# Patient Record
Sex: Female | Born: 1960 | Race: Black or African American | Hispanic: No | Marital: Single | Smoking: Never smoker
Health system: Southern US, Community
[De-identification: ages and names within clinical notes are randomized; demographics above are authoritative.]

## PROBLEM LIST (undated history)

## (undated) DIAGNOSIS — J45909 Unspecified asthma, uncomplicated: Secondary | ICD-10-CM

## (undated) DIAGNOSIS — I10 Essential (primary) hypertension: Secondary | ICD-10-CM

## (undated) HISTORY — PX: HERNIA REPAIR: SHX51

## (undated) HISTORY — PX: APPENDECTOMY: SHX54

---

## 2017-02-20 HISTORY — PX: JOINT REPLACEMENT: SHX530

## 2017-04-16 ENCOUNTER — Emergency Department (HOSPITAL_COMMUNITY): Payer: Medicaid - Out of State

## 2017-04-16 ENCOUNTER — Emergency Department (HOSPITAL_BASED_OUTPATIENT_CLINIC_OR_DEPARTMENT_OTHER)
Admit: 2017-04-16 | Discharge: 2017-04-16 | Disposition: A | Discharge status: Home / Self Care | Payer: Medicaid - Out of State | Attending: Emergency Medicine | Admitting: Emergency Medicine

## 2017-04-16 ENCOUNTER — Emergency Department (HOSPITAL_COMMUNITY)
Admission: EM | Admit: 2017-04-16 | Discharge: 2017-04-16 | Disposition: A | Discharge status: Home / Self Care | Payer: Medicaid - Out of State | Attending: Physician Assistant | Admitting: Physician Assistant

## 2017-04-16 ENCOUNTER — Encounter (HOSPITAL_COMMUNITY): Payer: Self-pay

## 2017-04-16 ENCOUNTER — Other Ambulatory Visit: Payer: Self-pay

## 2017-04-16 DIAGNOSIS — I10 Essential (primary) hypertension: Secondary | ICD-10-CM | POA: Diagnosis not present

## 2017-04-16 DIAGNOSIS — M25461 Effusion, right knee: Secondary | ICD-10-CM | POA: Insufficient documentation

## 2017-04-16 DIAGNOSIS — Z96651 Presence of right artificial knee joint: Secondary | ICD-10-CM | POA: Insufficient documentation

## 2017-04-16 DIAGNOSIS — M7989 Other specified soft tissue disorders: Secondary | ICD-10-CM

## 2017-04-16 DIAGNOSIS — Z79899 Other long term (current) drug therapy: Secondary | ICD-10-CM | POA: Insufficient documentation

## 2017-04-16 DIAGNOSIS — M79609 Pain in unspecified limb: Secondary | ICD-10-CM | POA: Diagnosis not present

## 2017-04-16 DIAGNOSIS — J45909 Unspecified asthma, uncomplicated: Secondary | ICD-10-CM | POA: Diagnosis not present

## 2017-04-16 HISTORY — DX: Unspecified asthma, uncomplicated: J45.909

## 2017-04-16 HISTORY — DX: Essential (primary) hypertension: I10

## 2017-04-16 LAB — BASIC METABOLIC PANEL
Anion gap: 10 (ref 5–15)
BUN: 12 mg/dL (ref 6–20)
CHLORIDE: 101 mmol/L (ref 101–111)
CO2: 24 mmol/L (ref 22–32)
Calcium: 9.8 mg/dL (ref 8.9–10.3)
Creatinine, Ser: 0.51 mg/dL (ref 0.44–1.00)
GFR calc Af Amer: >60 mL/min (ref >60)
GFR calc non Af Amer: >60 mL/min (ref >60)
GLUCOSE: 90 mg/dL (ref 65–99)
POTASSIUM: 4 mmol/L (ref 3.5–5.1)
Sodium: 135 mmol/L (ref 135–145)

## 2017-04-16 LAB — CBC
HEMATOCRIT: 39.7 % (ref 36.0–46.0)
HEMOGLOBIN: 12.8 g/dL (ref 12.0–15.0)
MCH: 29.8 pg (ref 26.0–34.0)
MCHC: 32.2 g/dL (ref 30.0–36.0)
MCV: 92.5 fL (ref 78.0–100.0)
Platelets: 333 10*3/uL (ref 150–400)
RBC: 4.29 MIL/uL (ref 3.87–5.11)
RDW: 14.6 % (ref 11.5–15.5)
WBC: 5.3 10*3/uL (ref 4.0–10.5)

## 2017-04-16 MED ORDER — OXYCODONE-ACETAMINOPHEN 5-325 MG PO TABS: 1.0000 | ORAL_TABLET | Freq: Three times a day (TID) | ORAL | 0 refills | Status: AC | PRN

## 2017-04-16 MED ORDER — NAPROXEN 500 MG PO TABS: 500.0000 mg | ORAL_TABLET | Freq: Two times a day (BID) | ORAL | 0 refills | Status: AC

## 2017-04-16 MED ORDER — OXYCODONE-ACETAMINOPHEN 5-325 MG PO TABS
1.0000 | ORAL_TABLET | Freq: Once | ORAL | Status: AC
  Administered 2017-04-16: 1 via ORAL
  Filled 2017-04-16: qty 1

## 2017-04-16 NOTE — ED Notes (Signed)
Patient ambulatory to bathroom with steady gait at this time 

## 2017-04-16 NOTE — ED Triage Notes (Addendum)
Pt reports severe right knee pain and swelling after riding bus to [place] from [place]. Pt denies taking blood thinners. Knee swollen, red, and hot to touch in triage

## 2017-04-16 NOTE — ED Notes (Signed)
Patient verbalizes understanding of discharge instructions. Opportunity for questioning and answers were provided. Armband removed by staff, pt discharged from ED via wheelchair.  

## 2017-04-16 NOTE — Discharge Instructions (Signed)
Please read attached information regarding your condition.

## 2017-04-16 NOTE — ED Notes (Signed)
Patient transported to Ultrasound 

## 2017-04-16 NOTE — ED Notes (Signed)
Patient transported to X-ray 

## 2017-04-16 NOTE — ED Provider Notes (Signed)
[name] EMERGENCY DEPARTMENT Provider Note   CSN: [id number] Arrival date & time: 04/16/17  1018     History   Chief Complaint Chief Complaint  Patient presents with  . Leg Pain    HPI Olivia Green is a 57 y.o. female with past medical history of asthma, hypertension, who presents to ED for evaluation of right knee swelling, increased pain for the past 2 weeks.  She is status post right knee replacement approximately 2 month ago (January 9)  in [place].  She traveled on a bus ride from [place] to [place] approximately 2 weeks ago.  This is a 17-hour bus ride with only one 2-hour break in between.  She states that her knee felt more stiff and unsure if it healed after the surgery fully.  She reported bilateral lower extremity edema but states that the left knee has improved.  She has been ambulatory with a cane and walker secondary to pain after the surgery.  Denies any previous DVT or PE.  Denies any chest pain, shortness of breath or hemoptysis.  She ran out of the narcotic pain medication prescribed by her orthopedist.  She has been having "cold"-like symptoms including cough and congestion. Unsure if she had a fever.  HPI  Past Medical History:  Diagnosis Date  . Asthma   . Hypertension     There are no active problems to display for this patient.   Past Surgical History:  Procedure Laterality Date  . APPENDECTOMY    . CESAREAN SECTION    . HERNIA REPAIR      OB History    No data available       Home Medications    Prior to Admission medications   Medication Sig Start Date End Date Taking? Authorizing Provider  ibuprofen (ADVIL,MOTRIN) 600 MG tablet Take 600 mg by mouth 3 (three) times daily. 04/06/17  Yes [provider]  lidocaine (LIDODERM) 5 % Place 1 patch onto the skin every 12 (twelve) hours. 04/10/17  Yes [provider]  lisinopril (PRINIVIL,ZESTRIL) 10 MG tablet Take 10 mg by mouth every morning.  04/14/17  Yes [provider]  Multiple Vitamin (MULTIVITAMIN WITH MINERALS) TABS tablet Take 1 tablet by mouth daily.   Yes [provider]  omeprazole (PRILOSEC) 20 MG capsule Take 20 mg by mouth daily. 03/18/17  Yes [provider]  Oxycodone HCl 10 MG TABS Take 10-20 mg by mouth every 4 (four) hours as needed for pain. 02/21/17  Yes [provider]  SPIRIVA RESPIMAT 2.5 MCG/ACT AERS Take 2 puffs by mouth daily. 01/15/17  Yes [provider]  VENTOLIN HFA 108 (90 Base) MCG/ACT inhaler Take 2 puffs by mouth every 6 (six) hours as needed for shortness of breath or wheezing. 01/23/17  Yes [provider]  naproxen (NAPROSYN) 500 MG tablet Take 1 tablet (500 mg total) by mouth 2 (two) times daily. 04/16/17   Talyn Eddie, PA-C  oxyCODONE-acetaminophen (PERCOCET/ROXICET) 5-325 MG tablet Take 1 tablet by mouth every 8 (eight) hours as needed for severe pain. 04/16/17   Dietrich Pates, PA-C    Family History No family history on file.  Social History Social History   Tobacco Use  . Smoking status: Never Smoker  . Smokeless tobacco: Never Used  Substance Use Topics  . Alcohol use: No    Frequency: Never  . Drug use: No     Allergies   Patient has no known allergies.  Review of Systems Review of Systems  Constitutional: Negative for appetite change, chills and fever.  HENT: Negative for ear pain, rhinorrhea, sneezing and sore throat.   Eyes: Negative for photophobia and visual disturbance.  Respiratory: Negative for cough, chest tightness, shortness of breath and wheezing.   Cardiovascular: Negative for chest pain and palpitations.  Gastrointestinal: Negative for abdominal pain, blood in stool, constipation, diarrhea, nausea and vomiting.  Genitourinary: Negative for dysuria, hematuria and urgency.  Musculoskeletal: Positive for arthralgias, gait problem, joint swelling and myalgias.  Skin: Negative for rash.  Neurological: Negative for  dizziness, weakness and light-headedness.     Physical Exam Updated Vital Signs BP (!) 148/69 (BP Location: Right Arm)   Pulse 71   Temp 98.3 F (36.8 C) (Oral)   Resp 18   Ht 5\' 3"  (1.6 m)   Wt 99.8 kg (220 lb)   SpO2 96%   BMI 38.97 kg/m   Physical Exam  Constitutional: She appears well-developed and well-nourished. No distress.  HENT:  Head: Normocephalic and atraumatic.  Nose: Nose normal.  Eyes: Conjunctivae and EOM are normal. Left eye exhibits no discharge. No scleral icterus.  Neck: Normal range of motion. Neck supple.  Cardiovascular: Normal rate, regular rhythm, normal heart sounds and intact distal pulses. Exam reveals no gallop and no friction rub.  No murmur heard. Pulmonary/Chest: Effort normal and breath sounds normal. No respiratory distress.  Abdominal: Soft. Bowel sounds are normal. She exhibits no distension. There is no tenderness. There is no guarding.  Musculoskeletal: Normal range of motion. She exhibits edema and tenderness.  Erythema, edema and slight warmth of right knee.  There is a well-healed surgical scar noted.  Patient able to flex and extend knee without difficulty.  2+ DP pulse noted bilaterally.  Neurological: She is alert. She exhibits normal muscle tone. Coordination normal.  Skin: Skin is warm and dry. No rash noted.  Psychiatric: She has a normal mood and affect.  Nursing note and vitals reviewed.    ED Treatments / Results  Labs (all labs ordered are listed, but only abnormal results are displayed) Labs Reviewed  CBC WITH DIFFERENTIAL/PLATELET    EKG  EKG Interpretation None       Radiology Dg Knee Complete 4 Views Right  Result Date: 04/16/2017 CLINICAL DATA:  57 year old female with right knee pain and swelling for the past week. Recent long bus ride and immobile. Initial encounter. EXAM: RIGHT KNEE - COMPLETE 4+ VIEW COMPARISON:  None. FINDINGS: Post total right knee replacement without plain film findings to suggest  loosening. No fracture or dislocation. Joint effusion. IMPRESSION: Post total right knee replacement. Joint effusion. No fracture or dislocation. Electronically Signed   By: Lacy Duverney M.D.   On: 04/16/2017 14:46    Procedures Procedures (including critical care time)  Medications Ordered in ED Medications  oxyCODONE-acetaminophen (PERCOCET/ROXICET) 5-325 MG per tablet 1 tablet (not administered)     Initial Impression / Assessment and Plan / ED Course  I have reviewed the triage vital signs and the nursing notes.  Pertinent labs & imaging results that were available during my care of the patient were reviewed by me and considered in my medical decision making (see chart for details).     Patient presents to ED for evaluation of right knee swelling, erythema right knee for the past 2 weeks.  She is status post right knee replacement 2 months ago.  She did travel on a 17-hour bus ride from [place] to [place] 2  weeks ago.  On physical exam she does have some erythema and edema of the right knee.  She is able to flex and extend without difficulty.  She has 2+ DP pulses noted bilaterally.  Ultrasound was negative for DVT.  X-ray showed joint effusion with no other acute abnormalities.  Lab work including CBC, BMP unremarkable.  She is afebrile with no use of antipyretics. At this time I have low suspicion for vascular or infectious cause of her symptoms. Most likely inflammatory in nature.  Will give Ace wrap, anti-inflammatories.  She states that she will be in [place] for the next few weeks and would like a orthopedist to potentially follow up with here.  Will give contact information for orthopedist on call. Patient appears stable for d/c at this time. Strict return precautions given.  Portions of this note were generated with Scientist, clinical (histocompatibility and immunogenetics)Dragon dictation software. Dictation errors may occur despite best attempts at proofreading.   Final Clinical Impressions(s) / ED Diagnoses   Final  diagnoses:  Effusion of knee joint right    ED Discharge Orders        Ordered    naproxen (NAPROSYN) 500 MG tablet  2 times daily     04/16/17 1628    oxyCODONE-acetaminophen (PERCOCET/ROXICET) 5-325 MG tablet  Every 8 hours PRN     04/16/17 1628       Dietrich PatesKhatri, Noah Lembke, PA-C 04/16/17 1636    Abelino DerrickMackuen, Courteney Lyn, MD 04/17/17 1844

## 2017-04-16 NOTE — Progress Notes (Addendum)
*  Preliminary Results* Right lower extremity venous duplex completed. Right lower extremity is negative for deep vein thrombosis. There is evidence of right Baker's cyst.  04/16/2017 1:32 PM  Gertie FeyMichelle Dewel Lotter, BS, RVT, RDCS, RDMS

## 2017-05-13 ENCOUNTER — Emergency Department (HOSPITAL_COMMUNITY)
Admission: EM | Admit: 2017-05-13 | Discharge: 2017-05-13 | Disposition: A | Discharge status: Home / Self Care | Payer: Medicaid - Out of State | Attending: Emergency Medicine | Admitting: Emergency Medicine

## 2017-05-13 ENCOUNTER — Emergency Department (HOSPITAL_BASED_OUTPATIENT_CLINIC_OR_DEPARTMENT_OTHER): Payer: Medicaid - Out of State

## 2017-05-13 ENCOUNTER — Encounter (HOSPITAL_COMMUNITY): Payer: Self-pay

## 2017-05-13 ENCOUNTER — Other Ambulatory Visit: Payer: Self-pay

## 2017-05-13 DIAGNOSIS — Z79899 Other long term (current) drug therapy: Secondary | ICD-10-CM | POA: Diagnosis not present

## 2017-05-13 DIAGNOSIS — R609 Edema, unspecified: Secondary | ICD-10-CM

## 2017-05-13 DIAGNOSIS — I10 Essential (primary) hypertension: Secondary | ICD-10-CM | POA: Diagnosis not present

## 2017-05-13 DIAGNOSIS — Z96651 Presence of right artificial knee joint: Secondary | ICD-10-CM | POA: Insufficient documentation

## 2017-05-13 DIAGNOSIS — R600 Localized edema: Secondary | ICD-10-CM | POA: Diagnosis not present

## 2017-05-13 DIAGNOSIS — J45909 Unspecified asthma, uncomplicated: Secondary | ICD-10-CM | POA: Insufficient documentation

## 2017-05-13 DIAGNOSIS — M7989 Other specified soft tissue disorders: Secondary | ICD-10-CM | POA: Diagnosis not present

## 2017-05-13 DIAGNOSIS — R2241 Localized swelling, mass and lump, right lower limb: Secondary | ICD-10-CM | POA: Diagnosis present

## 2017-05-13 MED ORDER — HYDROCODONE-ACETAMINOPHEN 5-325 MG PO TABS: ORAL_TABLET | ORAL | 0 refills | Status: AC

## 2017-05-13 MED ORDER — IBUPROFEN 400 MG PO TABS
400.0000 mg | ORAL_TABLET | Freq: Once | ORAL | Status: AC
  Administered 2017-05-13: 400 mg via ORAL
  Filled 2017-05-13: qty 1

## 2017-05-13 NOTE — Progress Notes (Signed)
VASCULAR LAB PRELIMINARY  PRELIMINARY  PRELIMINARY  PRELIMINARY  Right lower extremity venous duplex completed.    Preliminary report:  There is no DVT or SVT noted in the right lower extremity.  There is fluid noted at lateral knee.  Raelie Lohr, RVT 05/13/2017, 12:44 PM

## 2017-05-13 NOTE — ED Notes (Signed)
Pt states had rt knee replacement 2 months ago in [place] rode on bus to [place] on Friday  and yesteray her lower leg started to swell, states she was here with her daughter that had twins and one had to have surgergy she has been helping with the other one,   was told by her dr that did her surgery she could go back to work but she is in pain, states  She is out of her pain meds , swelling started after bus ride

## 2017-05-13 NOTE — ED Provider Notes (Signed)
[facility] EMERGENCY DEPARTMENT Provider Note   CSN: [id number] Arrival date & time: 05/13/17  1018     History   Chief Complaint Chief Complaint  Patient presents with  . Leg Swelling   HPI   Blood pressure 138/90, pulse 67, temperature 98.4 F (36.9 C), temperature source Oral, resp. rate 16, height 5\' 3"  (1.6 m), weight 99.8 kg (220 lb), SpO2 100 %.  Olivia Green is a 57 y.o. female complaining of pain and swelling to posterior right knee onset yesterday she had about 14-hour bus ride from [place] 3 days ago.  She states that she came down to the area to help her sister with her twins 1 of which is thick.  There was no trauma to the knee, she has not taken any pain medication at home.  She feels that the leg is more swollen than it was several days ago.  She denies any history of VT E.  No chest pain, palpitations, hemoptysis.  No exogenous estrogen.  Past Medical History:  Diagnosis Date  . Asthma   . Hypertension     There are no active problems to display for this patient.   Past Surgical History:  Procedure Laterality Date  . APPENDECTOMY    . CESAREAN SECTION    . HERNIA REPAIR    . JOINT REPLACEMENT  02/20/2017   Knee right      OB History   None      Home Medications    Prior to Admission medications   Medication Sig Start Date End Date Taking? Authorizing Provider  acetaminophen (TYLENOL) 500 MG tablet Take 1,000 mg by mouth every 6 (six) hours as needed for mild pain.   Yes [provider]  amoxicillin (AMOXIL) 500 MG capsule Take 2,000 mg by mouth See admin instructions. Take 4 capsules one hour before dental procedures 04/30/17  Yes [provider]  celecoxib (CELEBREX) 200 MG capsule Take 400 mg by mouth 2 (two) times daily as needed for pain. 04/30/17  Yes [provider]  diclofenac sodium (VOLTAREN) 1 % GEL Apply 1 application topically 4 (four) times daily as needed for pain. 04/30/17  Yes  [provider]  ibuprofen (ADVIL,MOTRIN) 600 MG tablet Take 600 mg by mouth 3 (three) times daily. 04/06/17  Yes [provider]  lisinopril (PRINIVIL,ZESTRIL) 10 MG tablet Take 10 mg by mouth every morning. 04/14/17  Yes [provider]  Multiple Vitamin (MULTIVITAMIN WITH MINERALS) TABS tablet Take 1 tablet by mouth daily.   Yes [provider]  naproxen (NAPROSYN) 500 MG tablet Take 1 tablet (500 mg total) by mouth 2 (two) times daily. 04/16/17  Yes Khatri, Hina, PA-C  omeprazole (PRILOSEC) 20 MG capsule Take 20 mg by mouth daily. 03/18/17  Yes [provider]  SPIRIVA RESPIMAT 2.5 MCG/ACT AERS Take 2 puffs by mouth daily. 01/15/17  Yes [provider]  VENTOLIN HFA 108 (90 Base) MCG/ACT inhaler Take 2 puffs by mouth every 6 (six) hours as needed for shortness of breath or wheezing. 01/23/17  Yes [provider]  HYDROcodone-acetaminophen (NORCO/VICODIN) 5-325 MG tablet Take 1-2 tablets by mouth every 6 hours as needed for pain and/or cough. 05/13/17   Basha Krygier, Joni Reining, PA-C  oxyCODONE-acetaminophen (PERCOCET/ROXICET) 5-325 MG tablet Take 1 tablet by mouth every 8 (eight) hours as needed for severe pain. Patient not taking: Reported on 05/13/2017 04/16/17   Dietrich Pates, PA-C    Family History No family history on file.  Social History Social History   Tobacco Use  . Smoking status: Never Smoker  . Smokeless tobacco: Never Used  Substance Use Topics  . Alcohol use: No    Frequency: Never  . Drug use: No     Allergies   Patient has no known allergies.   Review of Systems Review of Systems  A complete review of systems was obtained and all systems are negative except as noted in the HPI and PMH.   Physical Exam Updated Vital Signs BP 138/90 (BP Location: Right Arm)   Pulse 67   Temp 98.4 F (36.9 C) (Oral)   Resp 16   Ht 5\' 3"  (1.6 m)   Wt 99.8 kg (220 lb)   SpO2 100%   BMI 38.97 kg/m   Physical Exam    Constitutional: She is oriented to person, place, and time. She appears well-developed and well-nourished. No distress.  Obese  HENT:  Head: Normocephalic and atraumatic.  Mouth/Throat: Oropharynx is clear and moist.  Eyes: Pupils are equal, round, and reactive to light. Conjunctivae and EOM are normal.  Neck: Normal range of motion.  Cardiovascular: Normal rate, regular rhythm and intact distal pulses.  Pulmonary/Chest: Effort normal and breath sounds normal.  Abdominal: Soft. There is no tenderness.  Musculoskeletal: Normal range of motion. She exhibits tenderness. She exhibits no edema or deformity.  Right knee:  Remote surgical scar, well-healed, distally neurovascularly intact, full active range of motion, no significant superficial collaterals, palpable cords.  Tender to palpation along the popliteal fossa  Neurological: She is alert and oriented to person, place, and time.  Skin: She is not diaphoretic.  Psychiatric: She has a normal mood and affect.  Nursing note and vitals reviewed.    ED Treatments / Results  Labs (all labs ordered are listed, but only abnormal results are displayed) Labs Reviewed - No data to display  EKG None  Radiology No results found.  Procedures Procedures (including critical care time)  Medications Ordered in ED Medications  ibuprofen (ADVIL,MOTRIN) tablet 400 mg (400 mg Oral Given 05/13/17 1250)     Initial Impression / Assessment and Plan / ED Course  I have reviewed the triage vital signs and the nursing notes.  Pertinent labs & imaging results that were available during my care of the patient were reviewed by me and considered in my medical decision making (see chart for details).     Vitals:   05/13/17 1031 05/13/17 1037  BP: 138/90   Pulse: 67   Resp: 16   Temp: 98.4 F (36.9 C)   TempSrc: Oral   SpO2: 100%   Weight:  99.8 kg (220 lb)  Height:  5\' 3"  (1.6 m)    Medications  ibuprofen (ADVIL,MOTRIN) tablet 400 mg  (400 mg Oral Given 05/13/17 1250)    Olivia Green is 57 y.o. female presenting with atraumatic and recurrent right lower extremity pain status post 14-hour bus ride from [place] several days ago.  Vital signs reassuring however given her recent trip will need to rule out DVT.  Verbal report venous duplex is negative.  Patient given knee sleeve, advised her to follow closely with her orthopedist and be compliant with her physical therapy.  Evaluation does not show pathology that would require ongoing emergent intervention or inpatient treatment. Pt is hemodynamically stable and mentating appropriately. Discussed findings and plan with patient/guardian, who agrees with care plan. All questions answered. Return precautions discussed and outpatient follow up given.     Final Clinical  Impressions(s) / ED Diagnoses   Final diagnoses:  Peripheral edema    ED Discharge Orders        Ordered    HYDROcodone-acetaminophen (NORCO/VICODIN) 5-325 MG tablet     05/13/17 1302       Elyon Zoll, Mardella Laymanicole, PA-C 05/13/17 1304    Gerhard MunchLockwood, Robert, MD 05/13/17 1654

## 2017-05-13 NOTE — ED Triage Notes (Signed)
Pt c/o right leg swelling around her knee. She states that she had a total knee replacement on her right knee about 2 months ago. States that the swelling started Friday after a long bus ride from [place] to [place].

## 2017-05-13 NOTE — Discharge Instructions (Addendum)
For pain control please take ibuprofen (also known as Motrin or Advil) 800mg (this is normally 4 over the counter pills) 3 times a day  for 5 days. Take with food to minimize stomach irritation. ° °Take vicodin for breakthrough pain, do not drink alcohol, drive, care for children or do other critical tasks while taking vicodin. ° °Please follow with your primary care doctor in the next 2 days for a check-up. They must obtain records for further management.  ° °Do not hesitate to return to the Emergency Department for any new, worsening or concerning symptoms.  ° ° °

## 2018-08-04 IMAGING — CR DG KNEE COMPLETE 4+V*R*
4 series · 4 of 4 positions shown · non-contrast
Comparison: None.

CLINICAL DATA: 56-year-old female with right knee pain and swelling
for the past week. Recent long bus ride and immobile. Initial
encounter.

EXAM:
RIGHT KNEE - COMPLETE 4+ VIEW

[knee ap]
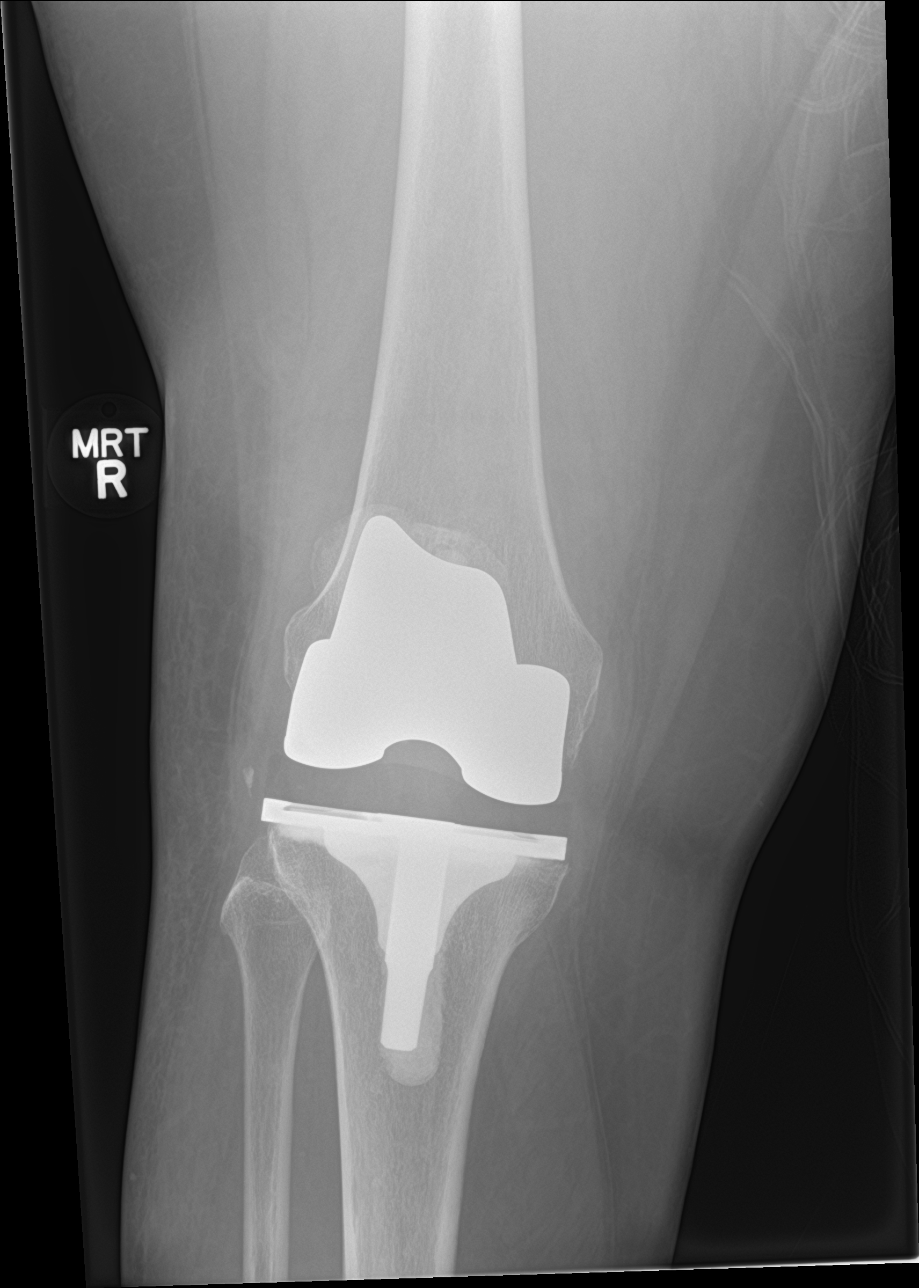

[knee lat]
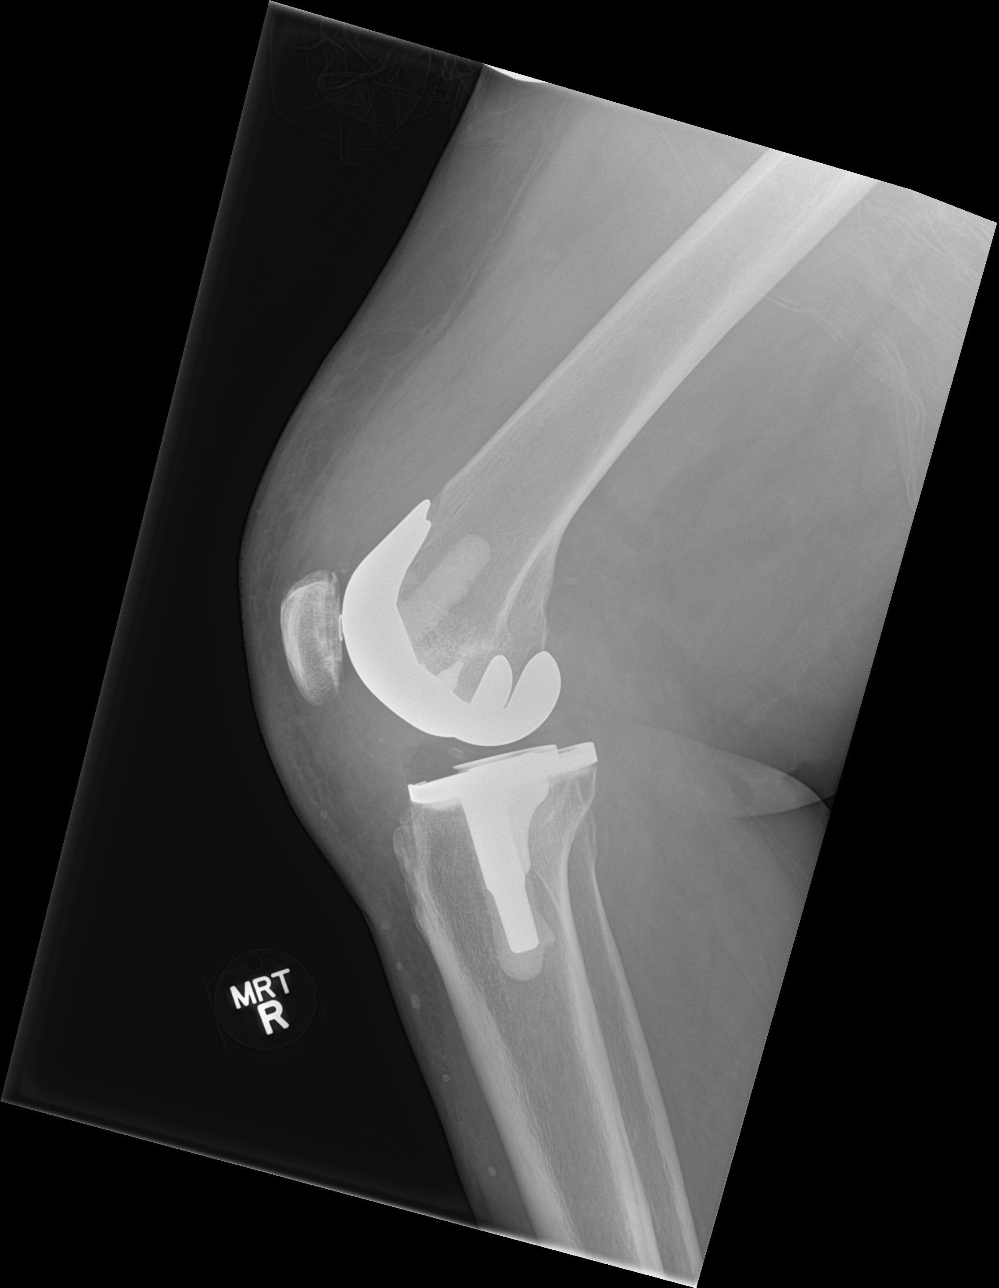

[knee obl (1 of 2)]
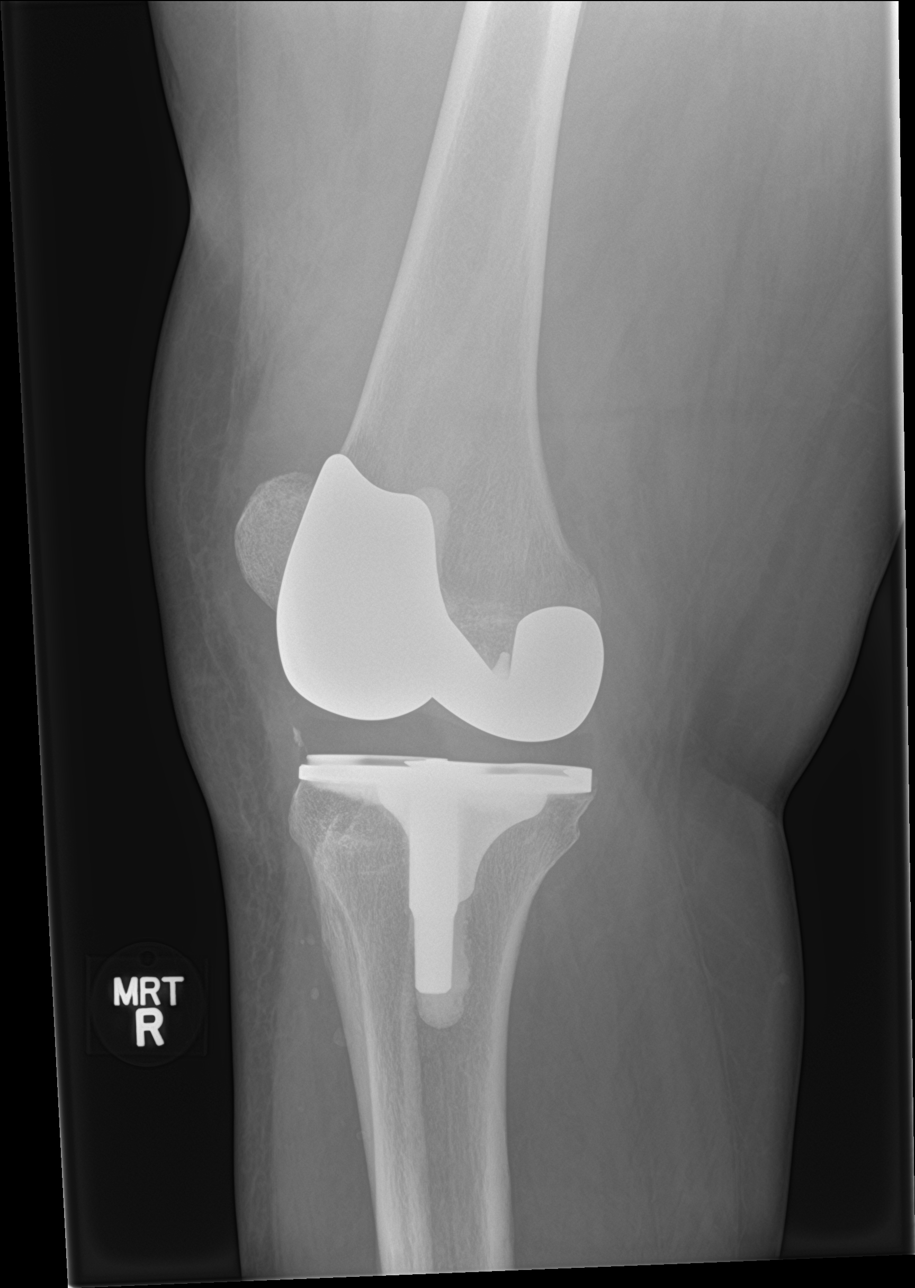

[knee obl (2 of 2)]
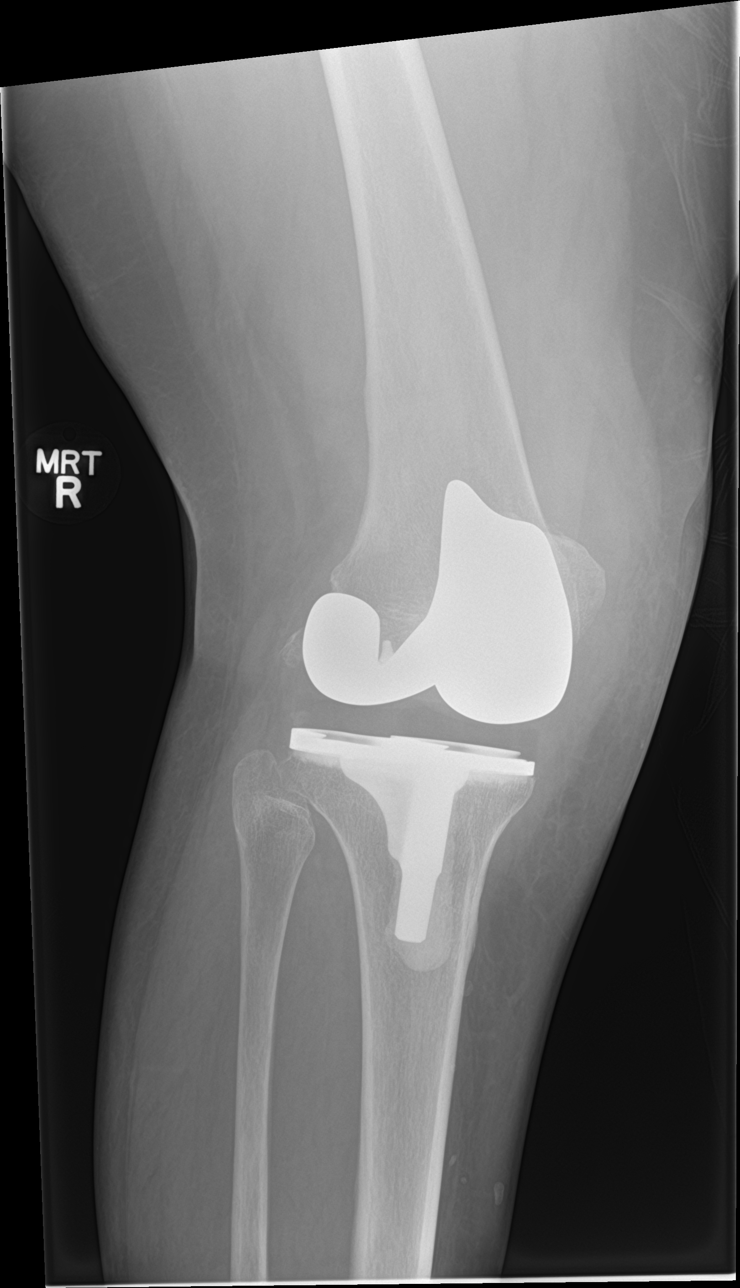

[4 of 4 positions shown; findings below may reference images not displayed]

FINDINGS: Post total right knee replacement without plain film findings to
suggest loosening.

No fracture or dislocation.

Joint effusion.
IMPRESSION: Post total right knee replacement.

Joint effusion.

No fracture or dislocation.
